# Patient Record
Sex: Female | Born: 2000 | Race: Black or African American | Hispanic: No | Marital: Single | Smoking: Never smoker
Health system: Southern US, Community
[De-identification: ages and names within clinical notes are randomized; demographics above are authoritative.]

---
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
## 2020-04-26 ENCOUNTER — Other Ambulatory Visit: Payer: Self-pay

## 2020-04-26 ENCOUNTER — Emergency Department (HOSPITAL_BASED_OUTPATIENT_CLINIC_OR_DEPARTMENT_OTHER): Payer: Medicaid - Out of State

## 2020-04-26 ENCOUNTER — Emergency Department (HOSPITAL_BASED_OUTPATIENT_CLINIC_OR_DEPARTMENT_OTHER)
Admission: EM | Admit: 2020-04-26 | Discharge: 2020-04-26 | Disposition: A | Discharge status: Home / Self Care | Payer: Medicaid - Out of State | Attending: Emergency Medicine | Admitting: Emergency Medicine

## 2020-04-26 ENCOUNTER — Encounter (HOSPITAL_BASED_OUTPATIENT_CLINIC_OR_DEPARTMENT_OTHER): Payer: Self-pay

## 2020-04-26 DIAGNOSIS — M79601 Pain in right arm: Secondary | ICD-10-CM | POA: Insufficient documentation

## 2020-04-26 DIAGNOSIS — M25511 Pain in right shoulder: Secondary | ICD-10-CM | POA: Diagnosis present

## 2020-04-26 NOTE — Discharge Instructions (Addendum)
There were no acute abnormalities on the x-rays, including no sign of fracture or dislocation, however, there could be injuries to the soft tissues, such as the ligaments or tendons that are not seen on xrays.  Antiinflammatory medications: Take 600 mg of ibuprofen every 6 hours or 440 mg (over the counter dose) to 500 mg (prescription dose) of naproxen every 12 hours for the next 3 days. After this time, these medications may be used as needed for pain. Take these medications with food to avoid upset stomach. Choose only one of these medications, do not take them together. Acetaminophen (generic for Tylenol): Should you continue to have additional pain while taking the ibuprofen or naproxen, you may add in acetaminophen as needed. Your daily total maximum amount of acetaminophen from all sources should be limited to 4000mg /day for persons without liver problems, or 2000mg /day for those with liver problems. Ice: May apply ice to the area over the next 24 hours for 15 minutes at a time to reduce swelling. Elevation: Keep the extremity elevated as often as possible to reduce pain and inflammation. Follow up: If symptoms are improving, you may follow up with your primary care provider for any continued management. If symptoms are not starting to improve within a week, you should follow up with the orthopedic specialist within two weeks. Return: Return to the ED for numbness, weakness, increasing pain, overall worsening symptoms, loss of function, or if symptoms are not improving, you have tried to follow up with the orthopedic specialist, and have been unable to do so.  For prescription assistance, may try using prescription discount sites or apps, such as goodrx.com or Good Rx smart phone app.

## 2020-04-26 NOTE — ED Triage Notes (Signed)
Pt c/o pain to right UE x 3 days-denies injury-states pain worse with movement-NAD-steady gait

## 2020-04-26 NOTE — ED Provider Notes (Signed)
[facility] EMERGENCY DEPARTMENT Provider Note   CSN: [id number] Arrival date & time: 04/26/20  1842     History Chief Complaint  Patient presents with  . Arm Pain    Judith Gibbs is a 19 y.o. female.  HPI     Judith Gibbs is a 19 y.o. female, presenting to the ED with right shoulder pain for the last 3 days.  States she woke up with pain and soreness to the shoulder. She denies fever, numbness, weakness, falls/trauma, neck pain, or any other complaints.   History reviewed. No pertinent past medical history.  There are no problems to display for this patient.   Past Surgical History:  Procedure Laterality Date  . AXILLARY LYMPH NODE DISSECTION  2018     OB History   No obstetric history on file.     History reviewed. No pertinent family history.  Social History   Tobacco Use  . Smoking status: Never Smoker  . Smokeless tobacco: Never Used  Vaping Use  . Vaping Use: Never used  Substance Use Topics  . Alcohol use: Never  . Drug use: Never    Home Medications Prior to Admission medications   Not on File    Allergies    Patient has no known allergies.  Review of Systems   Review of Systems  Constitutional: Negative for chills and fever.  Cardiovascular: Negative for chest pain.  Musculoskeletal: Positive for arthralgias. Negative for back pain and neck pain.  Neurological: Negative for weakness and numbness.    Physical Exam Updated Vital Signs BP 105/78 (BP Location: Left Arm)   Pulse 73   Temp 98.1 F (36.7 C) (Oral)   Resp 16   Ht 5\' 7"  (1.702 m)   Wt 100.8 kg   LMP 04/19/2020   SpO2 100%   BMI 34.82 kg/m   Physical Exam Vitals and nursing note reviewed.  Constitutional:      General: She is not in acute distress.    Appearance: She is well-developed. She is not diaphoretic.  HENT:     Head: Normocephalic and atraumatic.  Eyes:     Conjunctiva/sclera: Conjunctivae normal.  Cardiovascular:     Rate and Rhythm: Normal  rate and regular rhythm.     Pulses:          Radial pulses are 2+ on the right side and 2+ on the left side.  Pulmonary:     Effort: Pulmonary effort is normal.  Musculoskeletal:     Cervical back: Normal range of motion and neck supple. No tenderness.     Comments: Tenderness to the right trapezius and right anterior shoulder, as well as deltoid and biceps.  No swelling, color abnormality, temperature abnormality, deformity. Full range of motion in the right elbow. Motor function intact in the right shoulder, though some extremes of range of motion are limited due to patient's discomfort.  Tenderness and pain to right anterior shoulder and bicep with palpation and ROM.  Full ROM in bicep and triceps. No tricep tenderness.  No swelling, bruising, deformity.   Lymphadenopathy:     Cervical: No cervical adenopathy.  Skin:    General: Skin is warm and dry.     Capillary Refill: Capillary refill takes less than 2 seconds.     Coloration: Skin is not pale.  Neurological:     Mental Status: She is alert.     Comments: Sensation grossly intact to light touch through each of the nerve distributions of  the bilateral upper extremities. Abduction and adduction of the fingers intact against resistance. Grip strength equal bilaterally. Supination and pronation intact against resistance. Strength 5/5 through the cardinal directions of the bilateral wrists. Strength 5/5 with flexion and extension of the bilateral elbows. Patient can touch the thumb to each one of the fingertips without difficulty.  Patient can hold the "OK" sign against resistance.  Psychiatric:        Behavior: Behavior normal.     ED Results / Procedures / Treatments   Labs (all labs ordered are listed, but only abnormal results are displayed) Labs Reviewed - No data to display  EKG None  Radiology No results found. DG Shoulder Right  Result Date: 04/26/2020 CLINICAL DATA:  Pain and tenderness EXAM: RIGHT SHOULDER -  2+ VIEW COMPARISON:  None. FINDINGS: There is no evidence of fracture or dislocation. There is no evidence of arthropathy or other focal bone abnormality. Soft tissues are unremarkable. IMPRESSION: Negative. Electronically Signed   By: Jasmine Pang M.D.   On: 04/26/2020 20:38   DG Humerus Right  Result Date: 04/26/2020 CLINICAL DATA:  Right upper extremity pain for 3 days, worse with movement EXAM: RIGHT HUMERUS - 2+ VIEW COMPARISON:  Concurrent shoulder radiograph FINDINGS: No acute bony abnormality. Specifically, no fracture, subluxation, or dislocation. Alignment at the shoulder and elbow is grossly preserved though incompletely assessed on non dedicated radiographs. Soft tissues are unremarkable. Included portion of the right chest is free of acute abnormality. IMPRESSION: Negative. Electronically Signed   By: Kreg Shropshire M.D.   On: 04/26/2020 20:38    Procedures Procedures (including critical care time)  Medications Ordered in ED Medications - No data to display  ED Course  I have reviewed the triage vital signs and the nursing notes.  Pertinent labs & imaging results that were available during my care of the patient were reviewed by me and considered in my medical decision making (see chart for details).    MDM Rules/Calculators/A&P                          Patient presents with right shoulder and arm pain. No evidence of neurovascular compromise.  Other diagnoses, such as DVT, were considered, but thought less likely after consideration of patient's risk factors and physical exam is not suggestive. No acute abnormalities on imaging studies. Orthopedic follow-up should symptoms continue. The patient was given instructions for home care as well as return precautions. Patient voices understanding of these instructions, accepts the plan, and is comfortable with discharge.  I reviewed and interpreted the patient's radiological studies.  Final Clinical Impression(s) / ED  Diagnoses Final diagnoses:  Right arm pain    Rx / DC Orders ED Discharge Orders    None       Concepcion Living 04/30/20 0018    Vanetta Mulders, MD 04/30/20 1520

## 2020-07-12 ENCOUNTER — Other Ambulatory Visit: Payer: Self-pay

## 2020-07-12 ENCOUNTER — Ambulatory Visit (HOSPITAL_COMMUNITY)
Admission: EM | Admit: 2020-07-12 | Discharge: 2020-07-12 | Disposition: A | Discharge status: Home / Self Care | Payer: Medicaid - Out of State | Attending: Registered Nurse | Admitting: Registered Nurse

## 2020-07-12 ENCOUNTER — Encounter (HOSPITAL_COMMUNITY): Payer: Self-pay | Admitting: Registered Nurse

## 2020-07-12 DIAGNOSIS — F4323 Adjustment disorder with mixed anxiety and depressed mood: Secondary | ICD-10-CM | POA: Diagnosis not present

## 2020-07-12 NOTE — ED Notes (Signed)
Pt discharged in no acute distress. Verbalized understanding of AVS reviewed by staff. Pt retrieved belongings (cellphone) from locker and escorted to lobby where pt's mother was waiting to take pt home. Safety maintained.
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
## 2020-07-12 NOTE — ED Provider Notes (Signed)
Behavioral Health Urgent Care Medical Screening Exam  Patient Name: Judith Gibbs MRN: [id number] Date of Evaluation: 07/12/20 Chief Complaint:   Diagnosis:  Final diagnoses:  Adjustment disorder with mixed anxiety and depressed mood    History of Present illness: Judith Gibbs is a 19 y.o. female patient presents as a walk in accompanied by her mother.  Patient states that he mother brought her to hospital because she has concerns about her moving back to [place] because of her learning disability "I can barely read or write, and I have a daughter and my mom is concerned about how I can take care of her."  Patient also states that she sleeps a lot.   During evaluation Judith Gibbs is alert/oriented x 4; calm/cooperative; and mood is congruent with affect.  She does not appear to be responding to internal/external stimuli or delusional thoughts.  Patient denies suicidal/self-harm/homicidal ideation, psychosis, and paranoia.  Patient answered question appropriately.  Patient gave permission to speak to her mother for collateral information.  Patient mother wants patient to be set up for outpatient psychiatric services.  States patient has a history of major depression and PTSD, and a learning disability.    Discussed referral to outpatient services   Psychiatric Specialty Exam  Presentation  General Appearance:Appropriate for Environment;Casual  Eye Contact:Good  Speech:Clear and Coherent;Normal Rate  Speech Volume:Normal  Handedness:Right   Mood and Affect  Mood:Anxious  Affect:Appropriate;Congruent   Thought Process  Thought Processes:Coherent;Goal Directed  Descriptions of Associations:Intact  Orientation:Full (Time, Place and Person)  Thought Content:WDL  Hallucinations:None  Ideas of Reference:None  Suicidal Thoughts:No  Homicidal Thoughts:No   Sensorium  Memory:Immediate Good;Recent Good  Judgment:Intact  Insight:Good   Executive Functions   Concentration:Good  Attention Span:Good  Recall:Good  Fund of Knowledge:Fair  Language:Good   Psychomotor Activity  Psychomotor Activity:Normal   Assets  Assets:Communication Skills;Desire for Improvement;Housing;Social Support   Sleep  Sleep:Good  Number of hours: No data recorded  Physical Exam: Physical Exam Vitals and nursing note reviewed.  Constitutional:      Appearance: Normal appearance.  Pulmonary:     Effort: Pulmonary effort is normal.  Musculoskeletal:        General: Normal range of motion.     Cervical back: Normal range of motion.  Skin:    General: Skin is warm and dry.  Neurological:     Mental Status: She is alert.  Psychiatric:        Attention and Perception: Attention and perception normal.        Mood and Affect: Mood and affect normal.        Speech: Speech normal.        Behavior: Behavior normal.        Thought Content: Thought content normal.        Cognition and Memory: Cognition and memory normal.        Judgment: Judgment normal.    Review of Systems  Psychiatric/Behavioral: Negative for hallucinations, memory loss, substance abuse and suicidal ideas. The patient is not nervous/anxious and does not have insomnia.   All other systems reviewed and are negative.  Blood pressure 109/71, pulse 77, temperature (!) 97.5 F (36.4 C), temperature source Tympanic, SpO2 100 %. There is no height or weight on file to calculate BMI.  Musculoskeletal: Strength & Muscle Tone: within normal limits Gait & Station: normal Patient leans: N/A   [facility] MSE Discharge Disposition for Follow up and Recommendations: Based on my evaluation the patient does not  appear to have an emergency medical condition and can be discharged with resources and follow up care in outpatient services for Medication Management, Individual Therapy and Group Therapy Referred to [facility] open access and referral/resources given.  Salbador Fiveash, NP 07/12/2020, 5:50 PM

## 2020-07-12 NOTE — ED Notes (Signed)
Patient belongings in locker 9.

## 2021-05-03 IMAGING — DX DG SHOULDER 2+V*R*
3 series · 3 of 3 positions shown · non-contrast
Comparison: None.

CLINICAL DATA: Pain and tenderness

EXAM:
RIGHT SHOULDER - 2+ VIEW

[shoulder grashey]
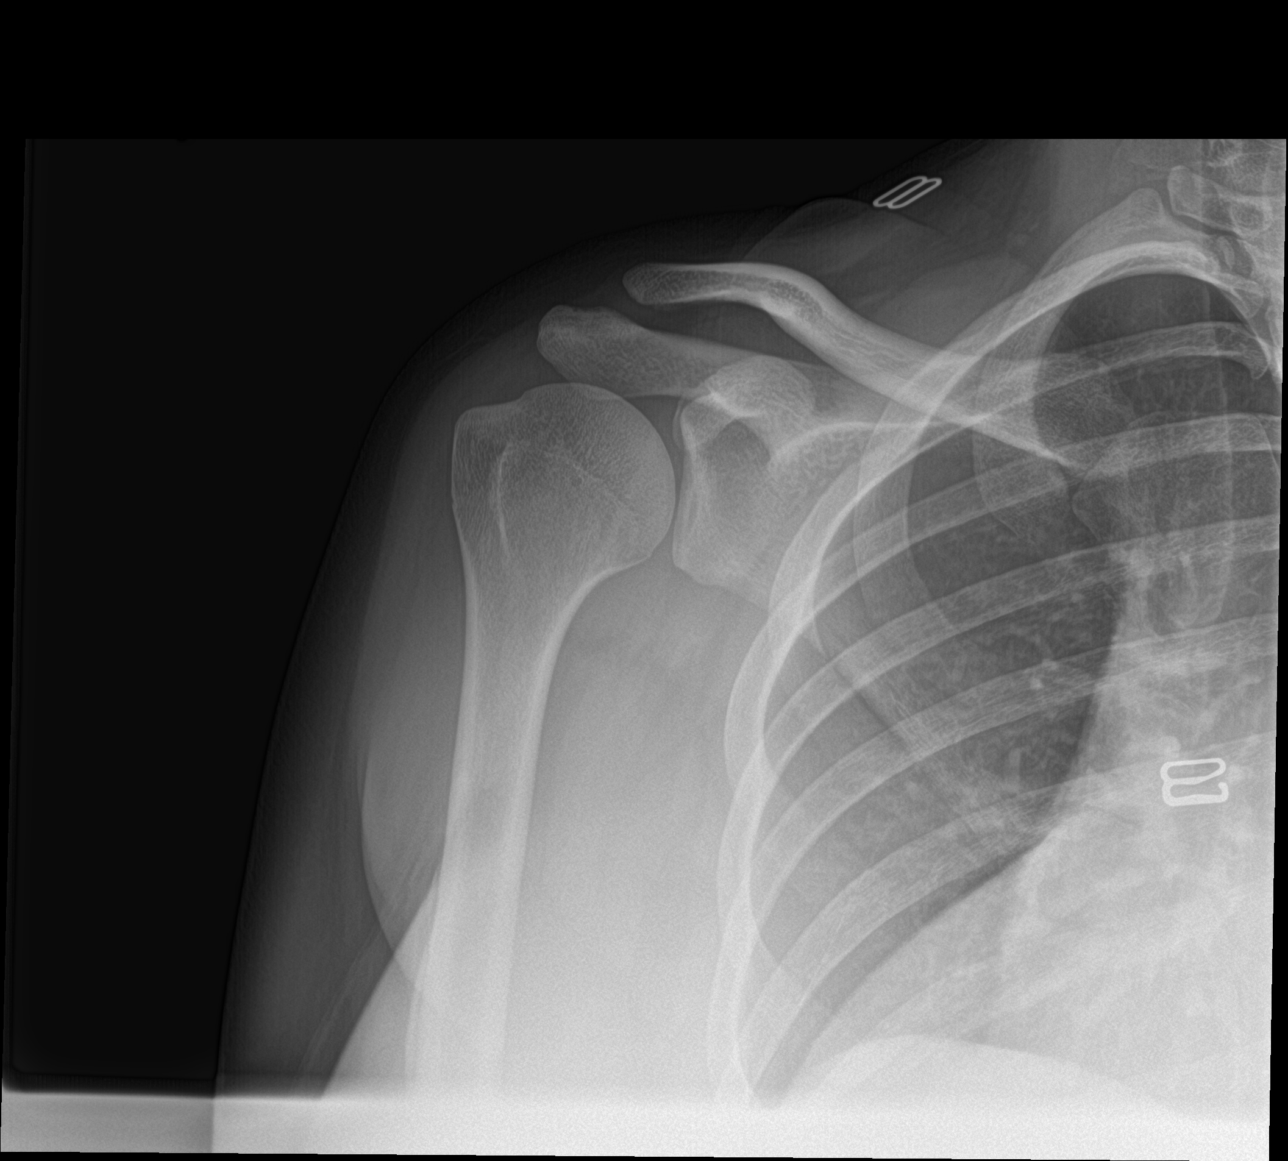

[shoulder y view]
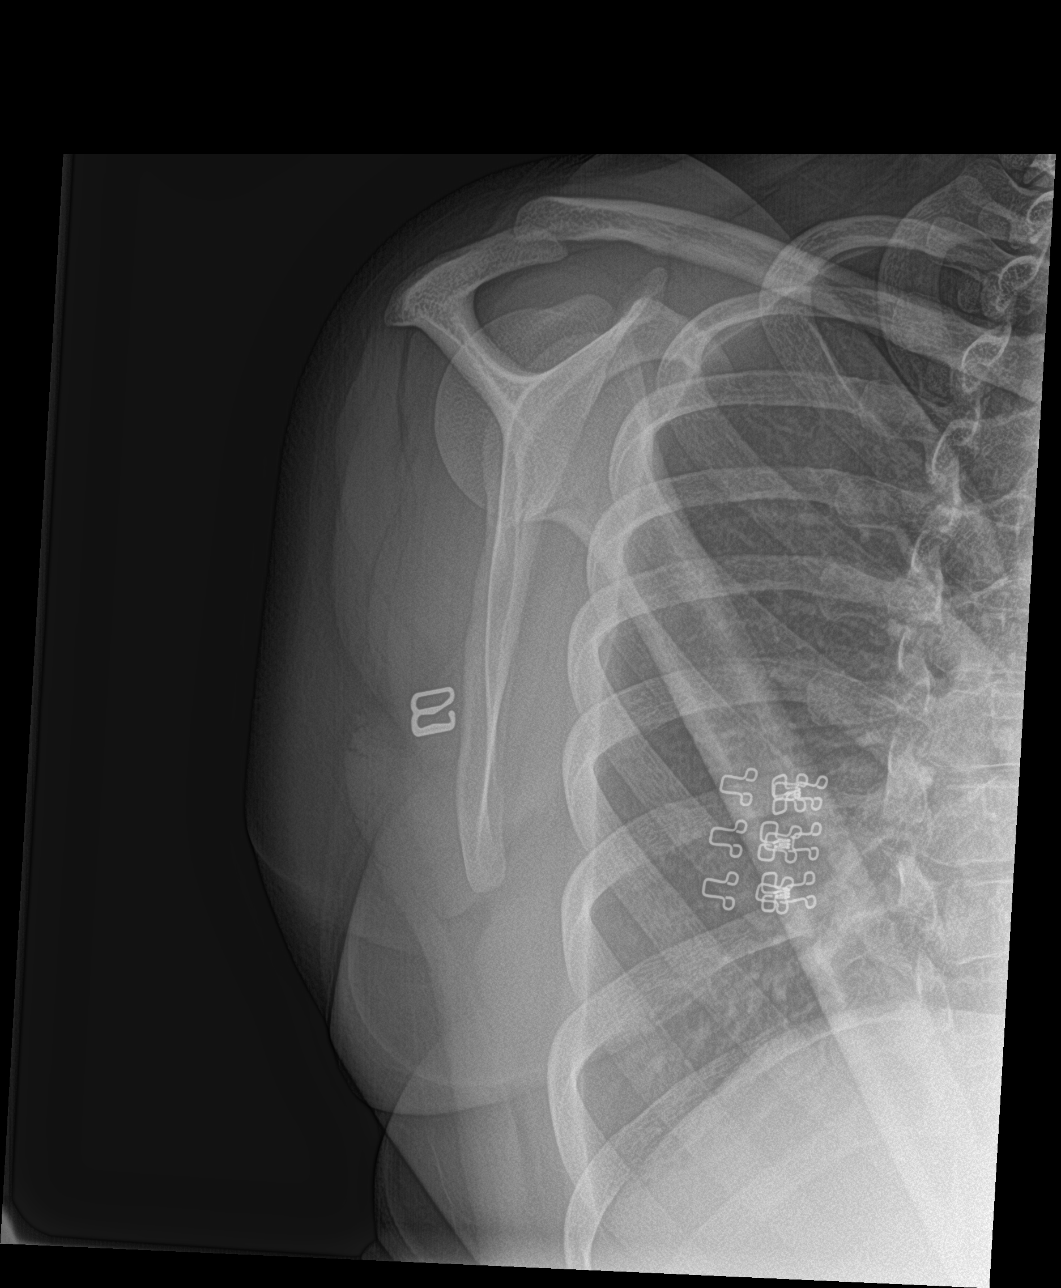

[shoulder axillary]
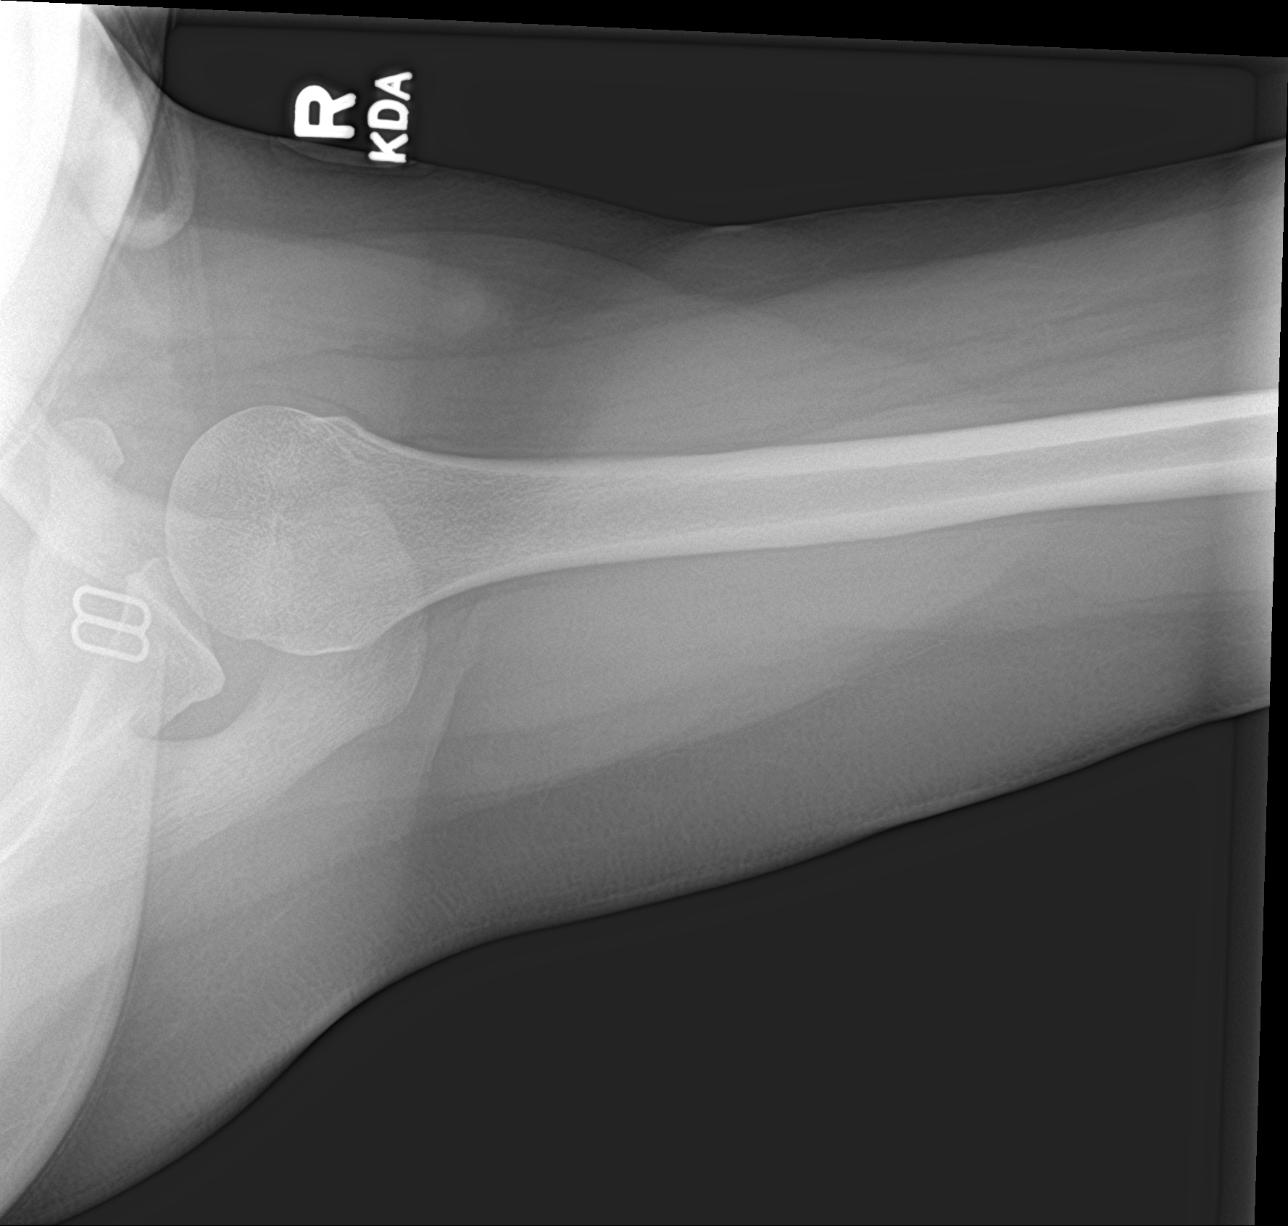

[3 of 3 positions shown; findings below may reference images not displayed]

FINDINGS: There is no evidence of fracture or dislocation. There is no
evidence of arthropathy or other focal bone abnormality. Soft
tissues are unremarkable.
IMPRESSION: Negative.
